# Patient Record
Sex: Male | Born: 1950 | Race: White | Hispanic: No | Marital: Single | Smoking: Never smoker
Health system: Southern US, Community
[De-identification: ages and names within clinical notes are randomized; demographics above are authoritative.]

---

## 2013-10-21 ENCOUNTER — Observation Stay: Payer: Self-pay | Admitting: Internal Medicine

## 2013-10-21 LAB — URINALYSIS, COMPLETE
Bacteria: NEGATIVE
Bilirubin,UR: NEGATIVE
Blood: NEGATIVE
Glucose,UR: NEGATIVE mg/dL (ref 0–75)
Leukocyte Esterase: NEGATIVE
NITRITE: NEGATIVE
Ph: 5 (ref 4.5–8.0)
Protein: NEGATIVE
RBC,UR: NONE SEEN /HPF (ref 0–5)
Specific Gravity: 1.025 (ref 1.003–1.030)

## 2013-10-21 LAB — BASIC METABOLIC PANEL
ANION GAP: 3 — AB (ref 7–16)
BUN: 13 mg/dL (ref 7–18)
Calcium, Total: 9.9 mg/dL (ref 8.5–10.1)
Chloride: 104 mmol/L (ref 98–107)
Co2: 28 mmol/L (ref 21–32)
Creatinine: 1.11 mg/dL (ref 0.60–1.30)
EGFR (African American): >60
Glucose: 101 mg/dL — ABNORMAL HIGH (ref 65–99)
Osmolality: 270 (ref 275–301)
POTASSIUM: 3.8 mmol/L (ref 3.5–5.1)
Sodium: 135 mmol/L — ABNORMAL LOW (ref 136–145)

## 2013-10-21 LAB — CBC WITH DIFFERENTIAL/PLATELET
Basophil #: 0.1 10*3/uL (ref 0.0–0.1)
Basophil %: 0.8 %
EOS ABS: 0.1 10*3/uL (ref 0.0–0.7)
EOS PCT: 1.3 %
HCT: 52.4 % — ABNORMAL HIGH (ref 40.0–52.0)
HGB: 18 g/dL (ref 13.0–18.0)
LYMPHS PCT: 28.6 %
Lymphocyte #: 2.5 10*3/uL (ref 1.0–3.6)
MCH: 32.3 pg (ref 26.0–34.0)
MCHC: 34.3 g/dL (ref 32.0–36.0)
MCV: 94 fL (ref 80–100)
MONOS PCT: 10.3 %
Monocyte #: 0.9 x10 3/mm (ref 0.2–1.0)
NEUTROS ABS: 5.2 10*3/uL (ref 1.4–6.5)
Neutrophil %: 59 %
PLATELETS: 235 10*3/uL (ref 150–440)
RBC: 5.56 10*6/uL (ref 4.40–5.90)
RDW: 14.2 % (ref 11.5–14.5)
WBC: 8.9 10*3/uL (ref 3.8–10.6)

## 2013-10-21 LAB — TROPONIN I

## 2013-10-22 LAB — MAGNESIUM: Magnesium: 2.1 mg/dL

## 2013-10-22 LAB — BASIC METABOLIC PANEL
Anion Gap: 3 — ABNORMAL LOW (ref 7–16)
BUN: 13 mg/dL (ref 7–18)
CALCIUM: 9.2 mg/dL (ref 8.5–10.1)
CO2: 27 mmol/L (ref 21–32)
CREATININE: 1.04 mg/dL (ref 0.60–1.30)
Chloride: 106 mmol/L (ref 98–107)
EGFR (African American): >60
GLUCOSE: 78 mg/dL (ref 65–99)
OSMOLALITY: 271 (ref 275–301)
POTASSIUM: 3.5 mmol/L (ref 3.5–5.1)
SODIUM: 136 mmol/L (ref 136–145)

## 2013-10-22 LAB — LIPID PANEL
CHOLESTEROL: 140 mg/dL (ref 0–200)
HDL: 37 mg/dL — AB (ref 40–60)
Ldl Cholesterol, Calc: 84 mg/dL (ref 0–100)
Triglycerides: 94 mg/dL (ref 0–200)
VLDL Cholesterol, Calc: 19 mg/dL (ref 5–40)

## 2013-10-22 LAB — TSH: THYROID STIMULATING HORM: 3.44 u[IU]/mL

## 2014-11-13 ENCOUNTER — Ambulatory Visit: Payer: Self-pay | Admitting: Physical Medicine and Rehabilitation

## 2015-01-04 NOTE — Discharge Summary (Signed)
PATIENT NAME:  Jeff Vazquez, Jeff Vazquez DATE OF BIRTH:  11/22/1950  DATE OF ADMISSION:  10/21/2013 DATE OF DISCHARGE:  10/22/2013  CARDIOLOGIST: [facility].   FINAL DIAGNOSES:  1. Numbness of the left lower extremity. This is not a stroke.  2. Shortness of breath on exertion.  3. Gout.  4. Psoriasis.  5. Obesity.   MEDICATIONS ON DISCHARGE: Include allopurinol 300 mg daily, aspirin 81 mg daily.   DIET: Regular diet, regular consistency.   ACTIVITY: As tolerated.   FOLLOWUP: I advised him to reschedule his stress test with [facility]. Follow up 1 to 2 weeks with your medical doctor.   HOSPITAL COURSE: The patient was admitted as an observation 10/21/2013, discharged 10/22/2013. Came in with numbness in the left lower extremity. Was admitted for suspected TIA. An MRI of the brain, carotid ultrasound and echocardiogram were ordered.   LABORATORY AND RADIOLOGICAL DATA DURING THE HOSPITAL COURSE: Included a troponin that was negative. Glucose 101, BUN 13, creatinine 1.11, sodium 135, potassium 3.8, chloride 104, CO2 of 28, calcium 9.9. White blood cell count 8.9, H and H 18.0 and 52.4, platelet count of 235. EKG: Normal sinus rhythm. Urinalysis 2+ ketones. CT scan of the head: No mass, hemorrhage or edema. Chest x-ray: Negative. TSH 3.44. LDL 84, HDL 37, triglycerides 94. Magnesium 2.1. Creatinine upon discharge 1.04. Echo showed EF 55% to 60%, impaired relaxation pattern of left ventricular diastolic filling. Ultrasound of the carotids showed less than 50% stenosis left carotid. MRI of the brain showed no acute intracranial abnormality. Mild bilateral parotid gland heterogenicity. Small T2 hyperintense nodules favored to be nonenlarged lymph nodes.   HOSPITAL COURSE BY PROBLEM LIST:  1. For the patient's numbness in the left lower extremity, this is not a stroke. I do not think this is a TIA either. Can work up outpatient musculoskeletal causes. Stroke workup here was negative.  I did advise the patient to stay on an aspirin, though; low-dose 81 mg daily.  2. Shortness of breath on exertion: This has been going on for a while. The patient does take Remicade injections and is trying to come off of them. That has a side effect of shortness of breath. Not sure if that is the cause, but I would agree with getting an outpatient stress test. He did have that [facility] with [facility] and reschedule that and I would continue an aspirin for right now.  3. Gout: Continue allopurinol.  4. Psoriasis: The patient does want to come off the Remicade. Will have to follow up with his dermatologist for this.  5. Obesity: BMI 38.7. Weight loss needed. Would recommend a stress test first prior to clearance for exercise program.    TIME SPENT ON DISCHARGE: 35 minutes.   ____________________________ Herschell Dimesichard J. Renae GlossWieting, MD rjw:gb D: 10/22/2013 17:24:48 ET T: 10/22/2013 23:26:35 ET JOB#: [id number]  cc: Herschell Dimesichard J. Renae GlossWieting, MD, <Dictator> [facility] Salley ScarletICHARD J Callie Facey MD ELECTRONICALLY SIGNED 11/03/2013 12:15

## 2015-01-04 NOTE — H&P (Signed)
PATIENT NAME:  Jeff Vazquez, Jeff Vazquez MR#:  [id number] DATE OF BIRTH:  03/28/1951  DATE OF ADMISSION:  10/21/2013   PRIMARY CARE PHYSICIAN: Nonlocal.   REFERRING PHYSICIAN: Dr. Governor Rooks.   CHIEF COMPLAINT: Left leg weakness.   HISTORY OF PRESENT ILLNESS: Mr. Spradley is a 64 year old white male with history of psoriasis and gouty arthritis. Has been experiencing some shortness of breath. Concerning this,  went to his primary care physician who recommended to lose weight. The patient has been undergoing exercise program about two weeks. Started to experience tingling in the left leg. Concerning this, went back to primary care physician who referred him for stress test. Yesterday while at rest started to experience increase of tingling sensation and weakness in the left leg. Concerning this, came to the Emergency Department. Work-up in the Emergency Department with CT head showed both cerebral arteries that show increased attenuation in a symmetric manner. The patient currently denies having any weakness. Did not have any chest pain, palpitations.   PAST MEDICAL HISTORY: Psoriasis and gouty arthritis.   PAST SURGICAL HISTORY: None.   SOCIAL HISTORY: No history of smoking, drinking alcohol or using illicit drugs. Lives by himself.   FAMILY HISTORY: Mother with coronary artery disease and congestive heart failure. Father died in his late 60s from old age.   REVIEW OF SYSTEMS: CONSTITUTIONAL: Denies any generalized weakness.  EYES: No change in vision.  ENT: No change in hearing. No sore throat.  RESPIRATORY: No cough, shortness of breath.   CARDIOVASCULAR: No chest pain, palpitations.  GASTROINTESTINAL: No nausea, vomiting and abdominal pain. GENITOURINARY: No dysuria or hematuria.  SKIN: Has psoriatic lesions.  MUSCULOSKELETAL: History of gouty osteoarthritis.  NEUROLOGIC:   left leg weakness  yesterday.  PHYSICAL EXAMINATION: GENERAL: This is a well-built, well-nourished, age-appropriate male  lying down in the bed, not in distress.  VITAL SIGNS: Temperature 97.8, pulse 70, blood pressure 125/82, respiratory rate of 16, oxygen saturation is 98% on room air.  HEENT: Head normocephalic, atraumatic. There is no scleral icterus. Conjunctivae normal. Pupils equal and react to light. Extraocular movements are intact. Mucous membranes moist. No pharyngeal erythema.  NECK: Supple. No lymphadenopathy. No JVD. No carotid bruit.  CHEST: No focal tenderness.  LUNGS: Bilaterally clear to auscultation.  HEART: S1, S2 regular. No murmurs are heard.  ABDOMEN: Bowel sounds present. Soft, nontender, nondistended, obese abdomen.   hepatosplenomegaly.  EXTREMITIES: No pedal edema. Pulses 2+.  SKIN: Has multiple psoriatic lesions all over the body.  MUSCULOSKELETAL: Good range of motion in all the extremities.  NEUROLOGIC: The patient is alert, oriented to place, person and time. Cranial nerves II through XII intact. Motor 5/5 in upper and lower extremities.   LABORATORY DATA: Urinalysis negative for nitrites and leukocyte esterase. CT head without contrast, both middle cerebral arteries showed increased attenuation in a symmetrical   . This finding is of questionable significance. There is no appreciable mass, hemorrhage or edema. No acute infarct.   CBC is completely within normal limits.   ASSESSMENT AND PLAN: Mr. Colucci is a 64 year old male who comes to the Emergency Department with left leg weakness.  1. Transient ischemic attack. Risk factors the patient's age, obesity, obstructive sleep apnea. Admit the patient to a monitored bed. Obtain MRI of the brain. Echocardiogram and carotid Dopplers. Obtain lipid profile. Keep the patient on aspirin 325 mg daily.  2. Obesity. Counseled with the patient regarding diet and exercise.  3. History of gouty arthritis.   . 4. History of psoriasis.  The patient follows up as an outpatient.  5. Keep the patient on deep vein thrombosis prophylaxis with Lovenox.    TIME SPENT: 45 minutes.    ____________________________ Susa GriffinsPadmaja Mirta Mally, MD pv:sg D: 10/21/2013 23:05:00 ET T: 10/22/2013 08:25:40 ET JOB#: [id number]  cc: Susa GriffinsPadmaja Christia Domke, MD, <Dictator> Clerance LavPADMAJA Kenshin Splawn MD ELECTRONICALLY SIGNED 11/15/2013 1:12

## 2015-09-13 ENCOUNTER — Other Ambulatory Visit: Payer: Self-pay

## 2015-09-13 ENCOUNTER — Encounter: Payer: Self-pay | Admitting: Emergency Medicine

## 2015-09-13 ENCOUNTER — Emergency Department
Admission: EM | Admit: 2015-09-13 | Discharge: 2015-09-13 | Disposition: A | Discharge status: Home / Self Care | Payer: Medicare PPO | Attending: Student | Admitting: Student

## 2015-09-13 DIAGNOSIS — R197 Diarrhea, unspecified: Secondary | ICD-10-CM | POA: Diagnosis not present

## 2015-09-13 DIAGNOSIS — R142 Eructation: Secondary | ICD-10-CM | POA: Diagnosis not present

## 2015-09-13 LAB — BASIC METABOLIC PANEL
Anion gap: 8 (ref 5–15)
BUN: 16 mg/dL (ref 6–20)
CALCIUM: 9.6 mg/dL (ref 8.9–10.3)
CO2: 27 mmol/L (ref 22–32)
CREATININE: 0.98 mg/dL (ref 0.61–1.24)
Chloride: 105 mmol/L (ref 101–111)
GFR calc non Af Amer: >60 mL/min (ref >60)
Glucose, Bld: 90 mg/dL (ref 65–99)
Potassium: 4.3 mmol/L (ref 3.5–5.1)
SODIUM: 140 mmol/L (ref 135–145)

## 2015-09-13 LAB — CBC
HCT: 49.1 % (ref 40.0–52.0)
Hemoglobin: 16.7 g/dL (ref 13.0–18.0)
MCH: 30.4 pg (ref 26.0–34.0)
MCHC: 34.1 g/dL (ref 32.0–36.0)
MCV: 89.2 fL (ref 80.0–100.0)
PLATELETS: 209 10*3/uL (ref 150–440)
RBC: 5.51 MIL/uL (ref 4.40–5.90)
RDW: 13.9 % (ref 11.5–14.5)
WBC: 7.8 10*3/uL (ref 3.8–10.6)

## 2015-09-13 LAB — URINALYSIS COMPLETE WITH MICROSCOPIC (ARMC ONLY)
BILIRUBIN URINE: NEGATIVE
Bacteria, UA: NONE SEEN
Glucose, UA: NEGATIVE mg/dL
LEUKOCYTES UA: NEGATIVE
Nitrite: NEGATIVE
PH: 5 (ref 5.0–8.0)
Protein, ur: NEGATIVE mg/dL
Specific Gravity, Urine: 1.026 (ref 1.005–1.030)

## 2015-09-13 LAB — TROPONIN I: Troponin I: <0.03 ng/mL (ref <0.031)

## 2015-09-13 LAB — LIPASE, BLOOD: Lipase: 50 U/L (ref 11–51)

## 2015-09-13 NOTE — ED Provider Notes (Signed)
Jeff Vazquez Emergency Department Provider Note  ____________________________________________  Time seen: Approximately 5:21 PM  I have reviewed the triage vital signs and the nursing notes.   HISTORY  Chief Complaint Dizziness and Abdominal Pain    HPI Jeff Vazquez is a 64 y.o. male with history of psoriasisand gout who presents for evaluation of 2-3 days gastric "distres" which he describes as frequent burping as well as several episodes of nonbloody diarrhea, constant since onset, currently mild to moderate, no modifying factors. He has been around his young grandchildren who have been sick recently. He denies any abdominal pain or vomiting. He denies any chest pain or difficulty breathing. He also has a litany of chronic complaints including greater than 1 year of intermittent dizziness which is not getting any better or worse, he underwent an MRI of his brain was evaluated by 2 separate neurologists to thought his symptoms were not concerning. He also is complaining of some intermittent left shoulder pain and sometimes neck pain which is primary care doctor has evaluated him for and thinks is related to tendinopathy. He feels fatigued. He also feels that for some time his vision has been worsening in both eyes.   No past medical history on file.  There are no active problems to display for this patient.   No past surgical history on file.  No current outpatient prescriptions on file.  Allergies Review of patient's allergies indicates no known allergies.  History reviewed. No pertinent family history.  Social History Social History  Substance Use Topics  . Smoking status: Never Smoker   . Smokeless tobacco: None  . Alcohol Use: None    Review of Systems Constitutional: No fever/chills Eyes: No visual changes. ENT: No sore throat. Cardiovascular: Denies chest pain. Respiratory: Denies shortness of breath. Gastrointestinal: No abdominal  pain.  No nausea, no vomiting.  + diarrhea.  No constipation. Genitourinary: Negative for dysuria. Musculoskeletal: Negative for back pain. Skin: Negative for rash. Neurological: Negative for headaches, focal weakness or numbness.  10-point ROS otherwise negative.  ____________________________________________   PHYSICAL EXAM:  VITAL SIGNS: ED Triage Vitals  Enc Vitals Group     BP 09/13/15 1437 128/92 mmHg     Pulse Rate 09/13/15 1437 93     Resp 09/13/15 1437 18     Temp 09/13/15 1437 97.9 F (36.6 C)     Temp Source 09/13/15 1437 Oral     SpO2 09/13/15 1437 95 %     Weight 09/13/15 1437 235 lb (106.595 kg)     Height 09/13/15 1437 5\' 8"  (1.727 m)     Head Cir --      Peak Flow --      Pain Score 09/13/15 1445 4     Pain Loc --      Pain Edu? --      Excl. in GC? --     Constitutional: Alert and oriented. Well appearing and in no acute distress. Eyes: Conjunctivae are normal. PERRL. EOMI. Head: Atraumatic. Nose: No congestion/rhinnorhea. Mouth/Throat: Mucous membranes are moist.  Oropharynx non-erythematous. Neck: No stridor.  Cardiovascular: Normal rate, regular rhythm. Grossly normal heart sounds.  Good peripheral circulation. Respiratory: Normal respiratory effort.  No retractions. Lungs CTAB. Gastrointestinal: Soft and nontender. No distention. No CVA tenderness. Genitourinary: deferred Musculoskeletal: No lower extremity tenderness nor edema.  No joint effusions. Neurologic:  Normal speech and language. No gross focal neurologic deficits are appreciated. No gait instability. 5 out of 5 strength in bilateral upper and  lower extremities, sensation intact to light touch throughout, cranial nerves II through XII intact, normal finger-nose-finger. Skin:  Skin is warm, dry and intact. No rash noted. Psychiatric: Mood and affect are normal. Speech and behavior are normal.  ____________________________________________   LABS (all labs ordered are listed, but only  abnormal results are displayed)  Labs Reviewed  URINALYSIS COMPLETEWITH MICROSCOPIC ([facility] ONLY) - Abnormal; Notable for the following:    Color, Urine YELLOW (*)    APPearance CLEAR (*)    Ketones, ur 2+ (*)    Hgb urine dipstick 1+ (*)    Squamous Epithelial / LPF 0-5 (*)    All other components within normal limits  BASIC METABOLIC PANEL  CBC  LIPASE, BLOOD  TROPONIN I  CBG MONITORING, ED   ____________________________________________  EKG  ED ECG REPORT I, Gayla Doss, the attending physician, personally viewed and interpreted this ECG.   Date: 09/13/2015  EKG Time: 14:52  Rate: 72  Rhythm: normal sinus rhythm  Axis: normal  Intervals:none  ST&T Change: Q waves in lead 3 and aVF are unchanged from 10/21/2013. No ST elevation.  ____________________________________________  RADIOLOGY  none ____________________________________________   PROCEDURES  Procedure(s) performed: None  Critical Care performed: No  ____________________________________________   INITIAL IMPRESSION / ASSESSMENT AND PLAN / ED COURSE  Pertinent labs & imaging results that were available during my care of the patient were reviewed by me and considered in my medical decision making (see chart for details).  Jeff Vazquez is a 64 y.o. male with history of psoriasisand gout who presents for evaluation of 2-3 days gastric "distress" which he describes as frequent burping as well as several episodes of nonbloody diarrhea, constant since onset, currently mild to moderate, no modifying factors. On exam, he is very well-appearing and in no acute distress. Vital signs stable, he is afebrile. Benign abdominal and cardiopulmonary examinations. Intact neurological examination. Labs reviewed. Normal CBC, CMP, troponin, lipase. Urinalysis is not consistent with infection. EKG with chronic changes which are unchanged from prior. Discussed his diarrhea may be viral in nature and we discussed supportive  care for that and adequate hydration. With regards to his chronic nonspecific complaints, I discussed that  he needs to follow-up with his primary care doctor, which he will do next week. Discussed with him that I doubt any acute life-threatening process at this time given his very well appearance and the chronicity of these issues. I doubt ACS, PE, pericarditis, acute aortic dissection, PE, CVA, obstruction or perforation. He is reassured. We discussed meticulous return precautions and he is comfortable with the discharge plan. ____________________________________________   FINAL CLINICAL IMPRESSION(S) / ED DIAGNOSES  Final diagnoses:  Diarrhea, unspecified type  Burping      Gayla Doss, MD 09/13/15 [phone]

## 2015-09-13 NOTE — ED Notes (Signed)
Pt here for multiple complaints.  Reports dizziness, gastric "distress", vision issues,  Intermittent left side body pain and stiff neck.  Pt reports fatigue. Symptoms have been ongoing for about 3 weeks.

## 2015-09-16 DIAGNOSIS — F411 Generalized anxiety disorder: Secondary | ICD-10-CM | POA: Diagnosis not present

## 2015-09-16 DIAGNOSIS — F430 Acute stress reaction: Secondary | ICD-10-CM | POA: Diagnosis not present

## 2015-10-10 DIAGNOSIS — Z13220 Encounter for screening for lipoid disorders: Secondary | ICD-10-CM | POA: Diagnosis not present

## 2015-10-10 DIAGNOSIS — Z79899 Other long term (current) drug therapy: Secondary | ICD-10-CM | POA: Diagnosis not present

## 2015-10-10 DIAGNOSIS — Z125 Encounter for screening for malignant neoplasm of prostate: Secondary | ICD-10-CM | POA: Diagnosis not present

## 2015-10-17 DIAGNOSIS — Z0000 Encounter for general adult medical examination without abnormal findings: Secondary | ICD-10-CM | POA: Diagnosis not present

## 2015-10-17 DIAGNOSIS — Z79899 Other long term (current) drug therapy: Secondary | ICD-10-CM | POA: Diagnosis not present

## 2015-10-17 DIAGNOSIS — Z1211 Encounter for screening for malignant neoplasm of colon: Secondary | ICD-10-CM | POA: Diagnosis not present

## 2015-12-17 DIAGNOSIS — J020 Streptococcal pharyngitis: Secondary | ICD-10-CM | POA: Diagnosis not present

## 2015-12-17 DIAGNOSIS — J029 Acute pharyngitis, unspecified: Secondary | ICD-10-CM | POA: Diagnosis not present

## 2015-12-17 DIAGNOSIS — R21 Rash and other nonspecific skin eruption: Secondary | ICD-10-CM | POA: Diagnosis not present

## 2016-01-29 DIAGNOSIS — Z79899 Other long term (current) drug therapy: Secondary | ICD-10-CM | POA: Diagnosis not present

## 2016-01-29 DIAGNOSIS — L400 Psoriasis vulgaris: Secondary | ICD-10-CM | POA: Diagnosis not present

## 2016-02-02 DIAGNOSIS — Z5181 Encounter for therapeutic drug level monitoring: Secondary | ICD-10-CM | POA: Diagnosis not present

## 2016-02-02 DIAGNOSIS — L408 Other psoriasis: Secondary | ICD-10-CM | POA: Diagnosis not present

## 2016-03-29 DIAGNOSIS — Z79899 Other long term (current) drug therapy: Secondary | ICD-10-CM | POA: Diagnosis not present

## 2016-04-16 DIAGNOSIS — Z1211 Encounter for screening for malignant neoplasm of colon: Secondary | ICD-10-CM | POA: Diagnosis not present

## 2016-04-20 DIAGNOSIS — Z79899 Other long term (current) drug therapy: Secondary | ICD-10-CM | POA: Diagnosis not present

## 2016-04-20 DIAGNOSIS — F419 Anxiety disorder, unspecified: Secondary | ICD-10-CM | POA: Diagnosis not present

## 2016-04-20 DIAGNOSIS — E781 Pure hyperglyceridemia: Secondary | ICD-10-CM | POA: Diagnosis not present

## 2016-04-20 DIAGNOSIS — R739 Hyperglycemia, unspecified: Secondary | ICD-10-CM | POA: Diagnosis not present

## 2016-04-20 DIAGNOSIS — M1A00X0 Idiopathic chronic gout, unspecified site, without tophus (tophi): Secondary | ICD-10-CM | POA: Diagnosis not present

## 2016-04-20 DIAGNOSIS — L4050 Arthropathic psoriasis, unspecified: Secondary | ICD-10-CM | POA: Diagnosis not present

## 2016-04-20 DIAGNOSIS — Z125 Encounter for screening for malignant neoplasm of prostate: Secondary | ICD-10-CM | POA: Diagnosis not present

## 2016-07-26 DIAGNOSIS — H2513 Age-related nuclear cataract, bilateral: Secondary | ICD-10-CM | POA: Diagnosis not present

## 2016-07-30 DIAGNOSIS — L400 Psoriasis vulgaris: Secondary | ICD-10-CM | POA: Diagnosis not present

## 2016-08-07 IMAGING — MR MRI LUMBAR SPINE WITHOUT CONTRAST
4 of 5 series · 25 of 48 positions shown · non-contrast
Comparison: None.

CLINICAL DATA: Low back pain with numbness and tingling into the
left leg

EXAM:
MRI LUMBAR SPINE WITHOUT CONTRAST
TECHNIQUE: Multiplanar, multisequence MR imaging of the lumbar spine was
performed. No intravenous contrast was administered.

[Series 2: T2 · sagittal · 4.0mm · 0.81mm/px · 6 of 16 slices shown (1 of 2)]
[im 1/16]
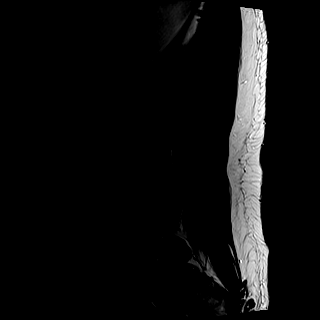
[im 4/16]
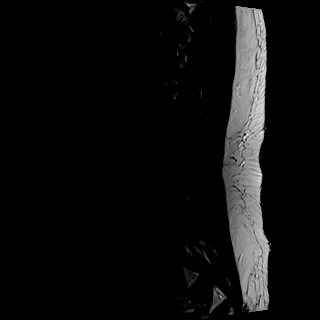
[im 7/16]
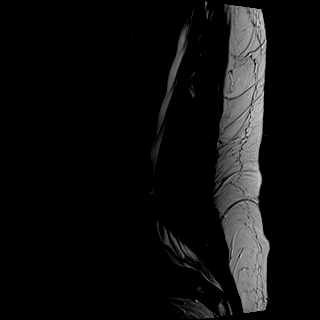
[im 10/16]
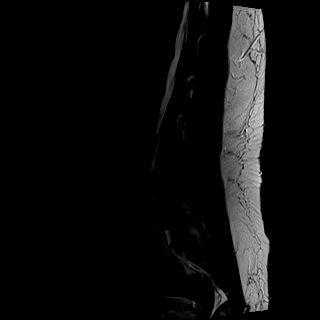
[im 13/16]
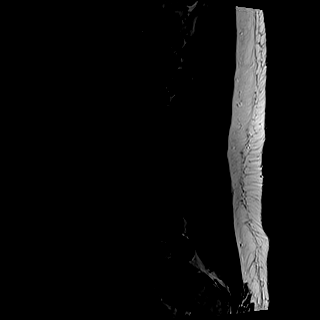
[im 16/16]
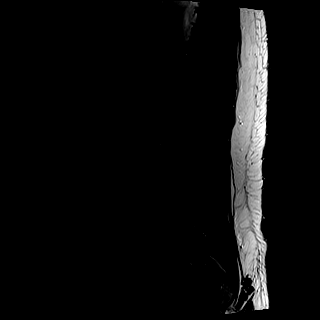

[Series 3: T1 · sagittal · 4.0mm · 0.41mm/px · 6 of 16 slices shown (1 of 2)]
[im 1/16]
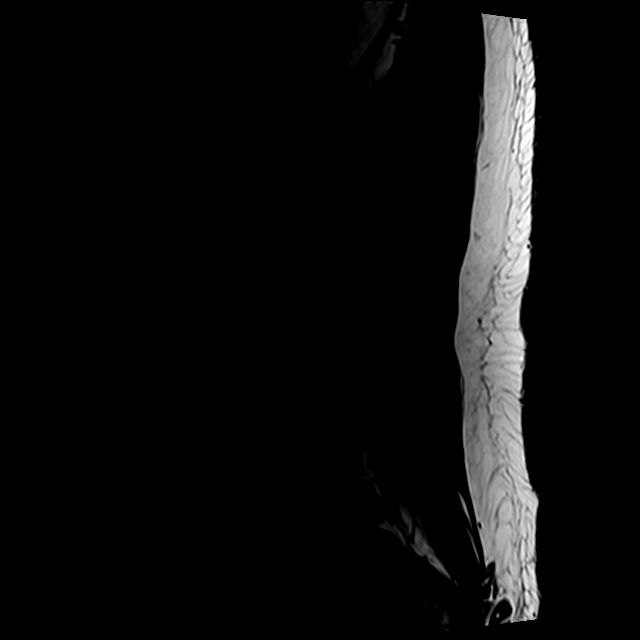
[im 4/16]
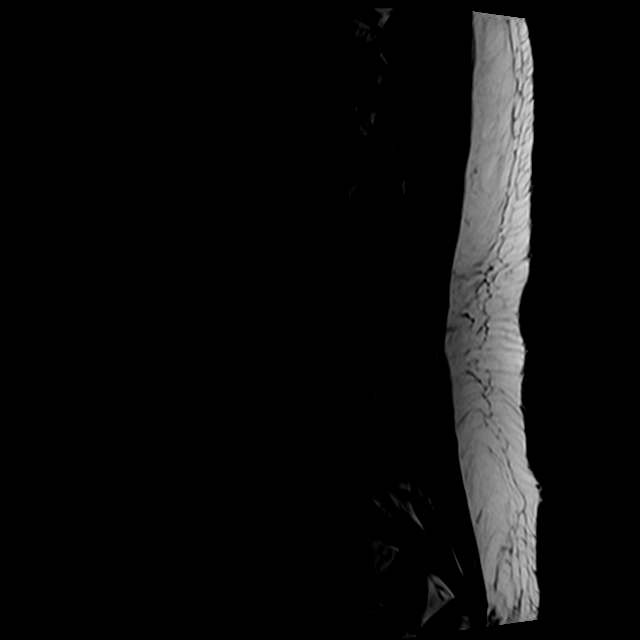
[im 7/16]
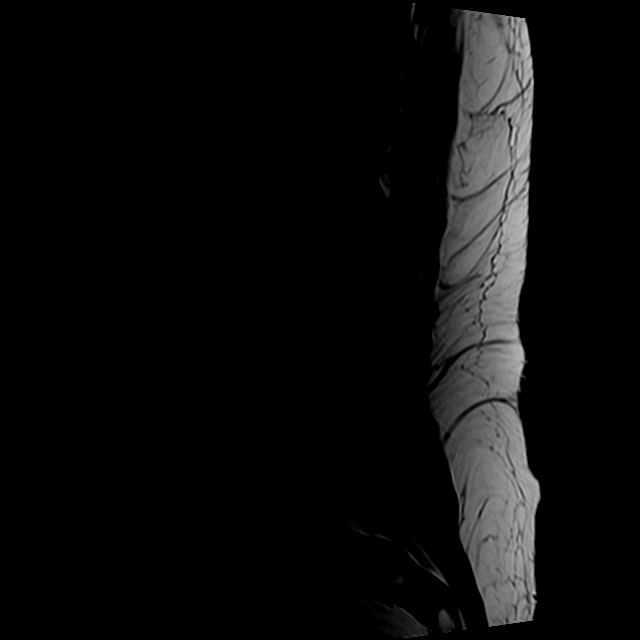
[im 10/16]
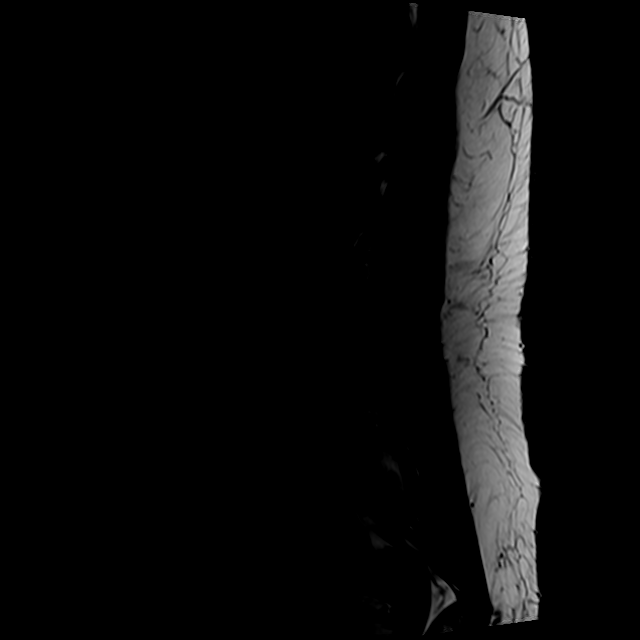
[im 13/16]
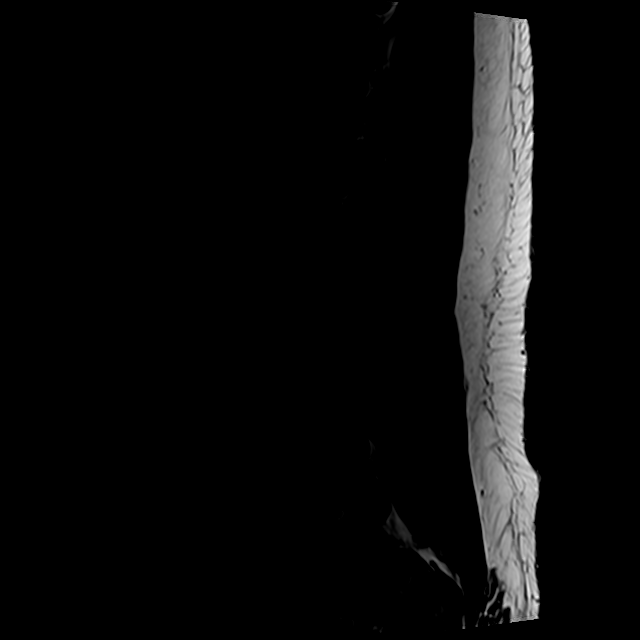
[im 16/16]
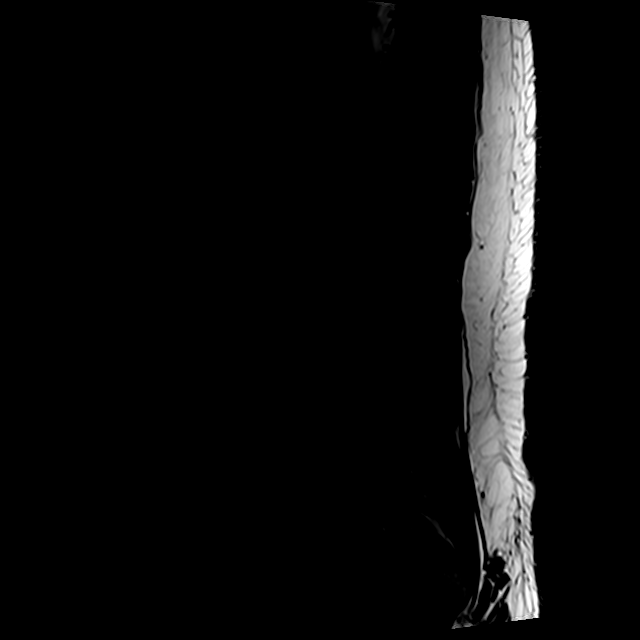

[Series 5: T2 · axial · 4.0mm · 0.78mm/px · z∈[-102,+140]mm · 9 of 43 slices shown (2 of 2)]
[im 1/43]
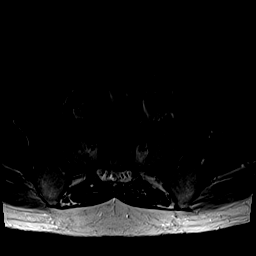
[im 7/43]
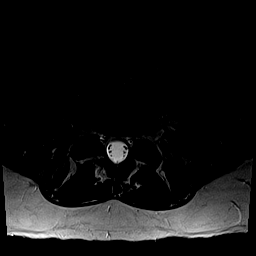
[im 13/43]
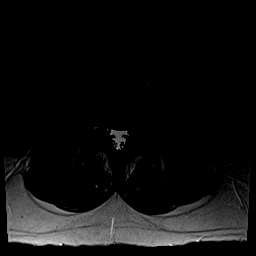
[im 19/43]
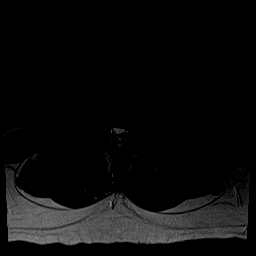
[im 22/43]
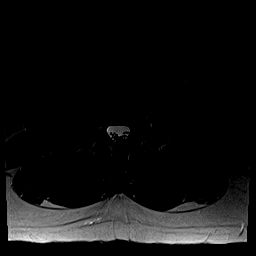
[im 25/43]
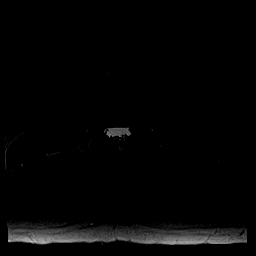
[im 31/43]
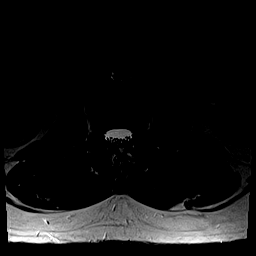
[im 37/43]
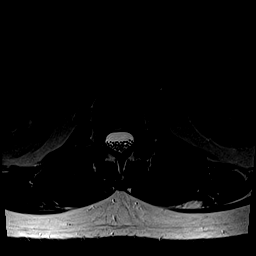
[im 43/43]
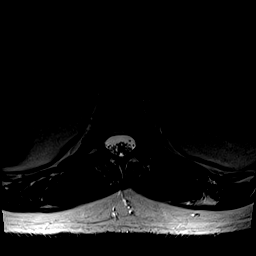

[Series 6: T1 · axial · 4.0mm · 0.39mm/px · z∈[-102,+111]mm · 4 of 43 slices shown (2 of 2)]
[im 1/43]
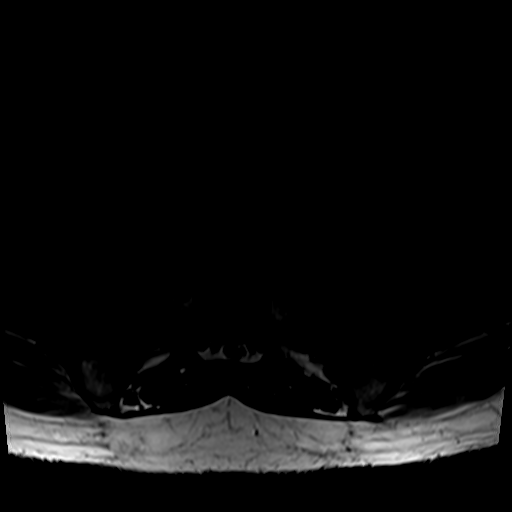
[im 7/43]
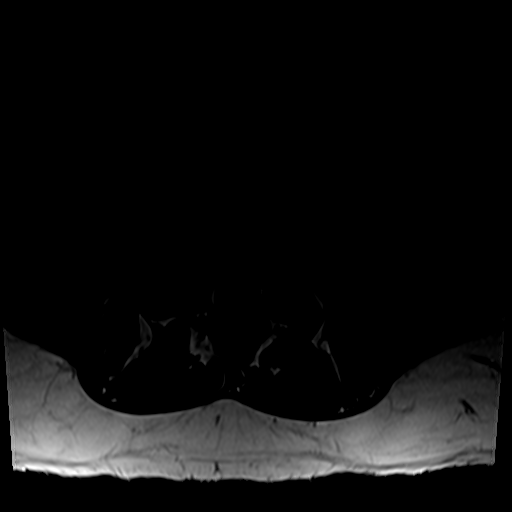
[im 22/43]
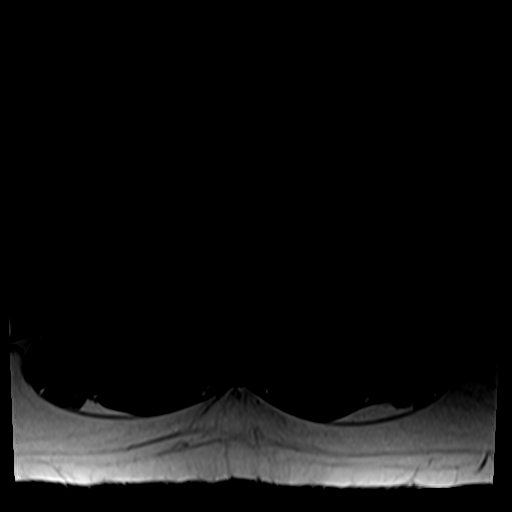
[im 37/43]
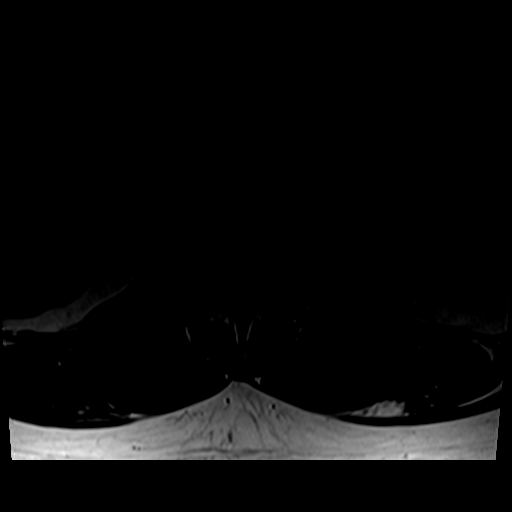

[25 of 48 positions shown; findings below may reference images not displayed]

FINDINGS: The vertebral bodies of the lumbar spine are normal in size. The
vertebral bodies of the lumbar spine are normal in alignment. There
is normal bone marrow signal demonstrated throughout the vertebra.
The intervertebral disc spaces are well-maintained. Mild disc
desiccation at L2-3 and L3-4.

The spinal cord is normal in signal and contour. The cord terminates
normally at T12 . The nerve roots of the cauda equina and the filum
terminale are normal.

The visualized portions of the SI joints are unremarkable.

The imaged intra-abdominal contents are unremarkable.

T11-12: Right paracentral disc protrusion. No foraminal or central
canal stenosis.

T12-L1: No significant disc bulge. No evidence of neural foraminal
stenosis. No central canal stenosis.

L1-L2: No significant disc bulge. No evidence of neural foraminal
stenosis. No central canal stenosis.

L2-L3: No significant disc bulge. No evidence of neural foraminal
stenosis. No central canal stenosis.

L3-L4: Mild broad-based disc bulge with a left paracentral disc
protrusion mild with caudal migration with mild mass effect on the
left intraspinal L4 nerve root. No evidence of neural foraminal
stenosis. No central canal stenosis.

L4-L5: No significant disc bulge. No evidence of neural foraminal
stenosis. No central canal stenosis. Moderate bilateral facet
arthropathy.

L5-S1: No significant disc bulge. No evidence of neural foraminal
stenosis. No central canal stenosis.
IMPRESSION: 1. At L3-4 there is a mild broad-based disc bulge with a left
paracentral disc protrusion mild with caudal migration with mild
mass effect on the left intraspinal L4 nerve root.

## 2016-10-01 DIAGNOSIS — L409 Psoriasis, unspecified: Secondary | ICD-10-CM | POA: Diagnosis not present

## 2016-10-01 DIAGNOSIS — L400 Psoriasis vulgaris: Secondary | ICD-10-CM | POA: Diagnosis not present

## 2016-10-15 DIAGNOSIS — R739 Hyperglycemia, unspecified: Secondary | ICD-10-CM | POA: Diagnosis not present

## 2016-10-15 DIAGNOSIS — M1A00X0 Idiopathic chronic gout, unspecified site, without tophus (tophi): Secondary | ICD-10-CM | POA: Diagnosis not present

## 2016-10-15 DIAGNOSIS — Z79899 Other long term (current) drug therapy: Secondary | ICD-10-CM | POA: Diagnosis not present

## 2016-10-15 DIAGNOSIS — Z125 Encounter for screening for malignant neoplasm of prostate: Secondary | ICD-10-CM | POA: Diagnosis not present

## 2016-10-15 DIAGNOSIS — E781 Pure hyperglyceridemia: Secondary | ICD-10-CM | POA: Diagnosis not present

## 2016-10-22 DIAGNOSIS — R739 Hyperglycemia, unspecified: Secondary | ICD-10-CM | POA: Diagnosis not present

## 2016-10-22 DIAGNOSIS — Z0000 Encounter for general adult medical examination without abnormal findings: Secondary | ICD-10-CM | POA: Diagnosis not present

## 2016-10-22 DIAGNOSIS — Z1211 Encounter for screening for malignant neoplasm of colon: Secondary | ICD-10-CM | POA: Diagnosis not present

## 2017-01-28 DIAGNOSIS — L400 Psoriasis vulgaris: Secondary | ICD-10-CM | POA: Diagnosis not present
